# Patient Record
Sex: Male | Born: 1990 | Hispanic: No | Marital: Single | Smoking: Never smoker
Health system: Southern US, Community
[De-identification: ages and names within clinical notes are randomized; demographics above are authoritative.]

## PROBLEM LIST (undated history)

## (undated) DIAGNOSIS — I509 Heart failure, unspecified: Secondary | ICD-10-CM

## (undated) DIAGNOSIS — I2699 Other pulmonary embolism without acute cor pulmonale: Secondary | ICD-10-CM

---

## 2019-09-04 ENCOUNTER — Other Ambulatory Visit: Payer: Self-pay

## 2019-09-04 ENCOUNTER — Encounter (HOSPITAL_COMMUNITY): Payer: Self-pay | Admitting: Emergency Medicine

## 2019-09-04 ENCOUNTER — Emergency Department (HOSPITAL_COMMUNITY): Payer: BLUE CROSS/BLUE SHIELD

## 2019-09-04 ENCOUNTER — Emergency Department (HOSPITAL_COMMUNITY)
Admission: EM | Admit: 2019-09-04 | Discharge: 2019-09-05 | Disposition: A | Discharge status: Home / Self Care | Payer: BLUE CROSS/BLUE SHIELD | Attending: Emergency Medicine | Admitting: Emergency Medicine

## 2019-09-04 DIAGNOSIS — Y929 Unspecified place or not applicable: Secondary | ICD-10-CM | POA: Diagnosis not present

## 2019-09-04 DIAGNOSIS — S99912A Unspecified injury of left ankle, initial encounter: Secondary | ICD-10-CM | POA: Diagnosis present

## 2019-09-04 DIAGNOSIS — Y999 Unspecified external cause status: Secondary | ICD-10-CM | POA: Diagnosis not present

## 2019-09-04 DIAGNOSIS — S93492A Sprain of other ligament of left ankle, initial encounter: Secondary | ICD-10-CM | POA: Diagnosis not present

## 2019-09-04 DIAGNOSIS — I509 Heart failure, unspecified: Secondary | ICD-10-CM | POA: Diagnosis not present

## 2019-09-04 DIAGNOSIS — Y939 Activity, unspecified: Secondary | ICD-10-CM | POA: Insufficient documentation

## 2019-09-04 DIAGNOSIS — X500XXA Overexertion from strenuous movement or load, initial encounter: Secondary | ICD-10-CM | POA: Diagnosis not present

## 2019-09-04 HISTORY — DX: Heart failure, unspecified: I50.9

## 2019-09-04 HISTORY — DX: Other pulmonary embolism without acute cor pulmonale: I26.99

## 2019-09-04 MED ORDER — HYDROCODONE-ACETAMINOPHEN 5-325 MG PO TABS
1.0000 | ORAL_TABLET | Freq: Once | ORAL | Status: AC
  Administered 2019-09-05: 1 via ORAL
  Filled 2019-09-04: qty 1

## 2019-09-04 NOTE — ED Triage Notes (Signed)
Pt presents for evaluation of left ankle pain after missing a step and twisting it.

## 2019-09-04 NOTE — ED Provider Notes (Signed)
[facility] DEPT Provider Note   CSN: [id number] Arrival date & time: 09/04/19  2255     History   Chief Complaint Chief Complaint  Patient presents with  . Ankle Injury    HPI Kordell Jafri is a 28 y.o. male.     The history is provided by medical records and the patient. No language interpreter was used.  Ankle Injury   Deon Duer is a 28 y.o. male who presented to the emergency department tonight for constant, aching left ankle and foot pain after missing a step and twisting it oddly tonight.  Denies numbness or weakness.  Tried to take a Flexeril for the pain just prior to arrival but has not noticed any improvement. No dizziness, lightheadedness or weakness.  No chest pain or shortness of breath.  Past Medical History:  Diagnosis Date  . CHF (congestive heart failure) ([facility])   . Pulmonary embolus ([facility])     There are no active problems to display for this patient.       Home Medications    Prior to Admission medications   Medication Sig Start Date End Date Taking? Authorizing Provider  naproxen (NAPROSYN) 500 MG tablet Take 1 tablet (500 mg total) by mouth 2 (two) times daily as needed for mild pain or moderate pain. 09/05/19   Ward, Ozella Almond, PA-C    Family History No family history on file.  Social History Social History   Tobacco Use  . Smoking status: Never Smoker  . Smokeless tobacco: Never Used  Substance Use Topics  . Alcohol use: Never    Frequency: Never  . Drug use: Yes    Types: Marijuana     Allergies   Azithromycin and Peanut-containing drug products   Review of Systems Review of Systems  Musculoskeletal: Positive for arthralgias and myalgias.  Skin: Negative for color change and wound.  Neurological: Negative for weakness and numbness.     Physical Exam Updated Vital Signs BP (!) 160/98 (BP Location: Left Arm)   Pulse (!) 118   Temp 98.4 F (36.9 C) (Oral)   Resp 18   Ht 5\' 11"   (1.803 m)   Wt (!) 199.6 kg   SpO2 97%   BMI 61.37 kg/m   Physical Exam Vitals signs and nursing note reviewed.  Constitutional:      General: He is not in acute distress.    Appearance: He is well-developed.  HENT:     Head: Normocephalic and atraumatic.  Neck:     Musculoskeletal: Neck supple.  Cardiovascular:     Rate and Rhythm: Regular rhythm.     Heart sounds: Normal heart sounds. No murmur.  Pulmonary:     Effort: Pulmonary effort is normal. No respiratory distress.     Breath sounds: Normal breath sounds. No wheezing or rales.  Musculoskeletal:     Comments: Tenderness to the left ATFL, but no tenderness to either malleolus.  He does have tenderness to the fifth metatarsal region of the left foot.  2+ DP.  Sensation intact.  Able to wiggle all toes without difficulty has full range of motion of the ankle as well.  Skin:    General: Skin is warm and dry.  Neurological:     Mental Status: He is alert.      ED Treatments / Results  Labs (all labs ordered are listed, but only abnormal results are displayed) Labs Reviewed - No data to display  EKG None  Radiology Dg Ankle Complete  Left  Result Date: 09/04/2019 CLINICAL DATA:  Twisting injury, ankle pain EXAM: LEFT ANKLE COMPLETE - 3+ VIEW COMPARISON:  None. FINDINGS: Diffuse soft tissue swelling. No acute bony abnormality. Specifically, no fracture, subluxation, or dislocation. IMPRESSION: No acute bony abnormality. Electronically Signed   By: Charlett NoseKevin  Dover M.D.   On: 09/04/2019 23:39   Dg Foot Complete Left  Result Date: 09/04/2019 CLINICAL DATA:  Twisting injury, ankle and foot pain EXAM: LEFT FOOT - COMPLETE 3+ VIEW COMPARISON:  None FINDINGS: Soft tissue swelling along the dorsum of the foot. No acute bony abnormality. Specifically, no fracture, subluxation, or dislocation. IMPRESSION: No acute bony abnormality. Electronically Signed   By: Charlett NoseKevin  Dover M.D.   On: 09/04/2019 23:40    Procedures Procedures  (including critical care time)  Medications Ordered in ED Medications  HYDROcodone-acetaminophen (NORCO/VICODIN) 5-325 MG per tablet 1 tablet (1 tablet Oral Given 09/05/19 0022)     Initial Impression / Assessment and Plan / ED Course  I have reviewed the triage vital signs and the nursing notes.  Pertinent labs & imaging results that were available during my care of the patient were reviewed by me and considered in my medical decision making (see chart for details).       Cornell BarmanBradley Coggins is a 28 y.o. male who presents to ED for left foot and ankle pain after missing a step with inversion injury just prior to arrival. NVI on exam. No wounds. X-ray shows no acute abnormalities.  Symptomatic home care instructions were discussed.  Crutches and ASO brace provided.  PCP or Ortho follow-up.  Reasons to return to ER were discussed and all questions answered.   Final Clinical Impressions(s) / ED Diagnoses   Final diagnoses:  Sprain of anterior talofibular ligament of left ankle, initial encounter    ED Discharge Orders         Ordered    naproxen (NAPROSYN) 500 MG tablet  2 times daily PRN     09/05/19 0000           Ward, Chase PicketJaime Pilcher, PA-C 09/05/19 0025    Palumbo, April, MD 09/05/19 [id number]

## 2019-09-04 NOTE — Discharge Instructions (Signed)
Naproxen as needed for pain. You can also take Tylenol. Ice and alleviate for additional pain relief. If your symptoms persist without improvement in one week, please follow up with your primary care doctor or the orthopedist listed.   TREATMENT  Rest, ice, elevation, and compression are the basic modes of treatment.    HOME CARE INSTRUCTIONS  Apply ice to the sore area for 15 to 20 minutes, 3 to 4 times per day. Do this while you are awake for the first 2 days. This can be stopped when the swelling goes away. Put the ice in a plastic bag and place a towel between the bag of ice and your skin.  Keep your leg elevated when possible to lessen swelling.  You may take off your ankle stabilizer at night and to take a shower or bath. Wiggle your toes several times per day if you are able.  ACTIVITY:            - Weight bearing as tolerated - if you can do it, do it. If it hurts, stop.             - Exercises should be limited to pain free range of motion            - Can start mobilization by tracing the alphabet with your foot in the air.      Return to ER if your toes are numb or blue, new or worsening symptoms develop or you have any additional concerns.

## 2019-09-05 MED ORDER — NAPROXEN 500 MG PO TABS: 500.0000 mg | ORAL_TABLET | Freq: Two times a day (BID) | ORAL | 0 refills | Status: AC | PRN

## 2020-04-18 IMAGING — DX DG FOOT COMPLETE 3+V*L*
3 series · 3 of 3 positions shown · non-contrast
Comparison: None

CLINICAL DATA: Twisting injury, ankle and foot pain

EXAM:
LEFT FOOT - COMPLETE 3+ VIEW

[foot ap (1 of 2)]
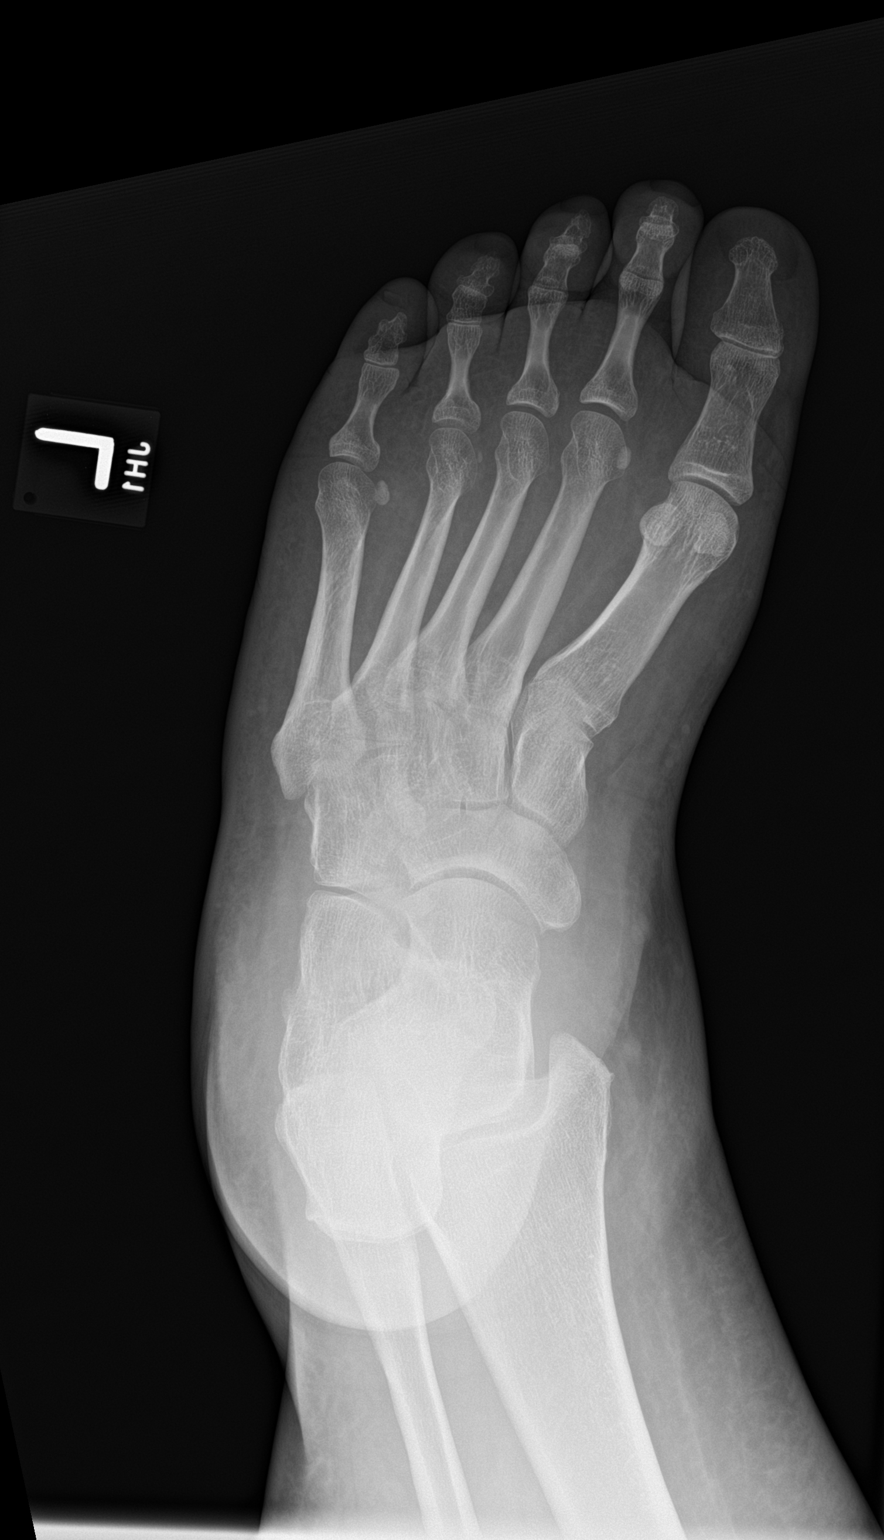

[foot lat]
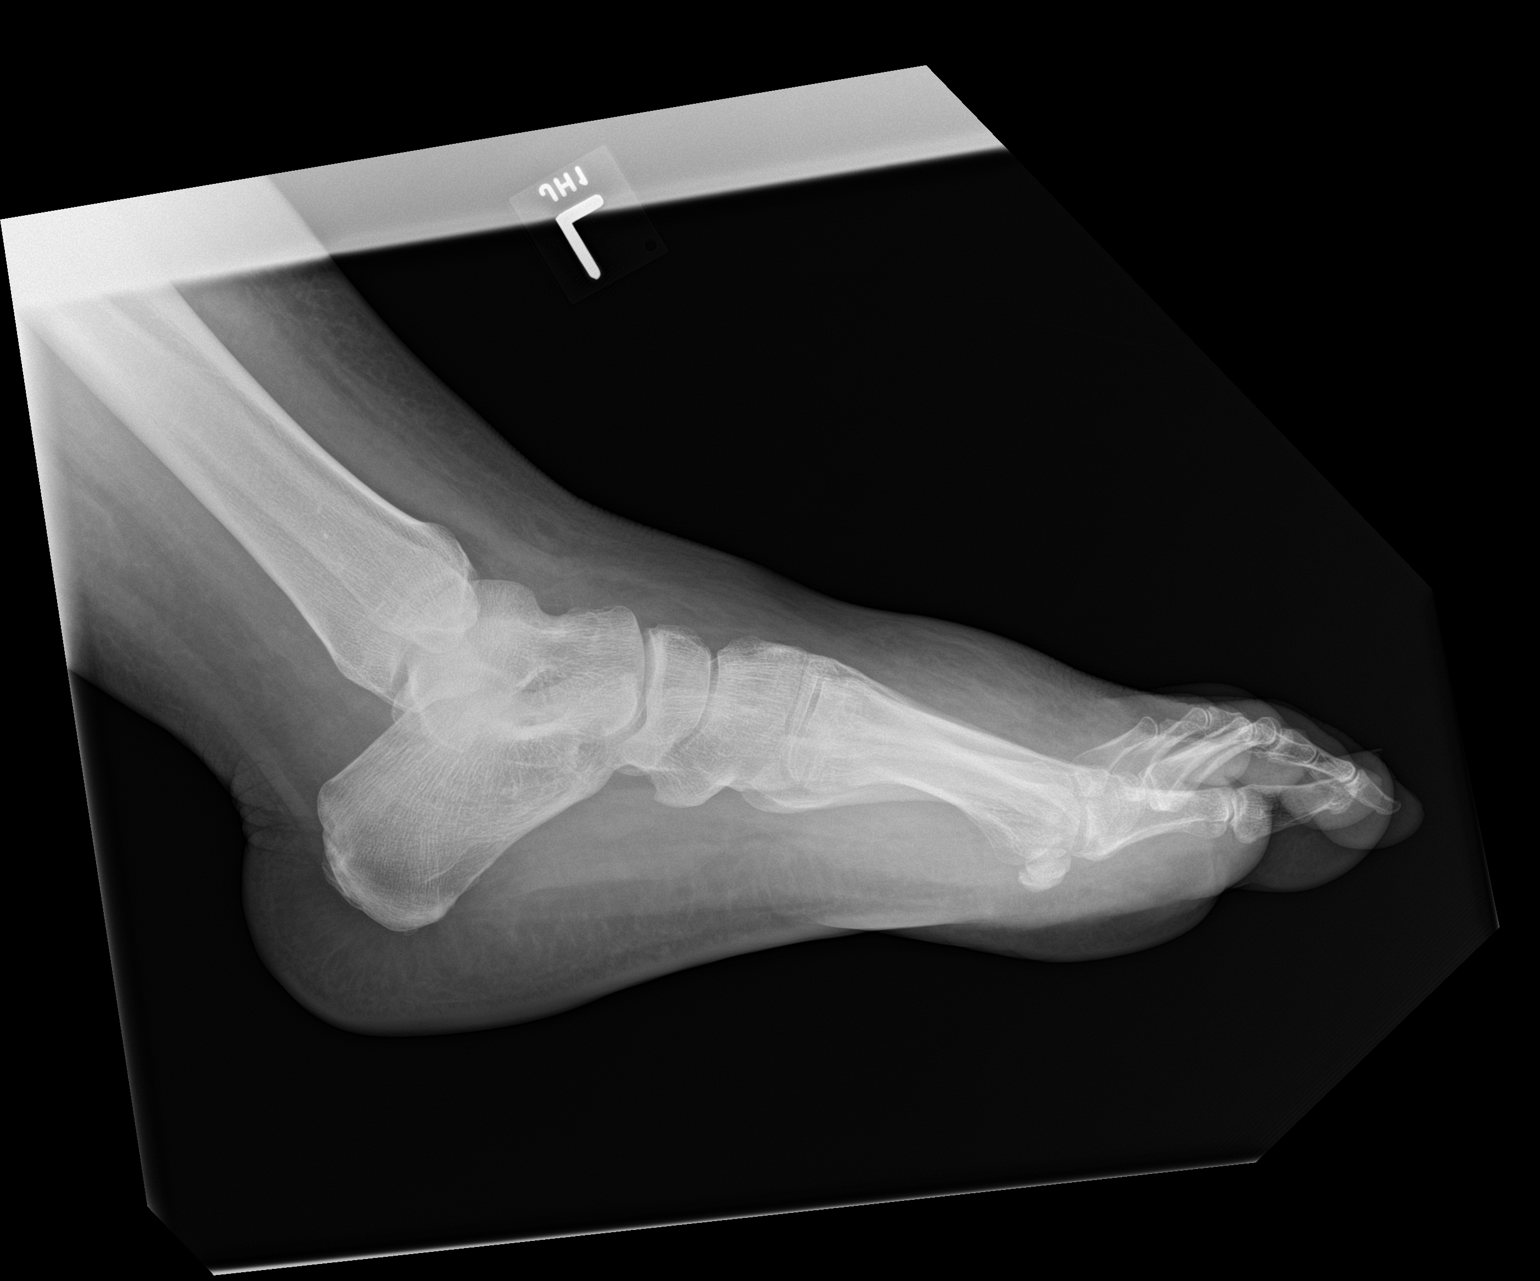

[foot ap (2 of 2)]
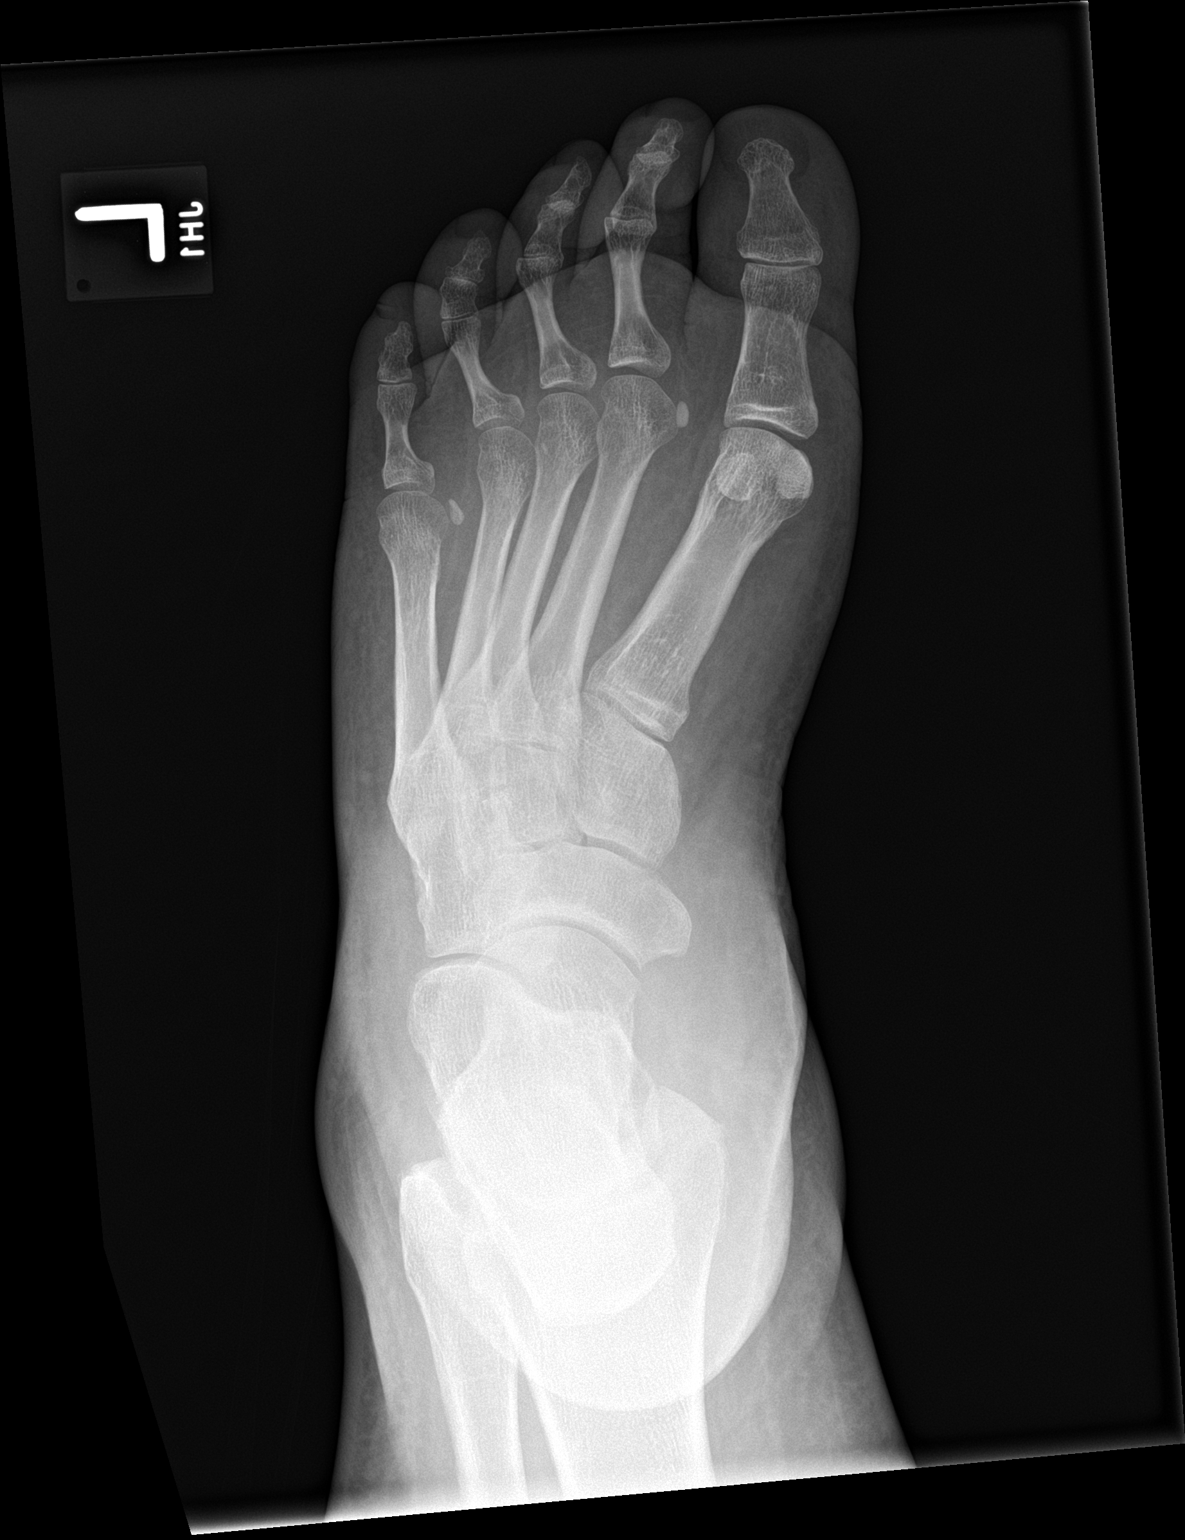

[3 of 3 positions shown; findings below may reference images not displayed]

FINDINGS: Soft tissue swelling along the dorsum of the foot. No acute bony
abnormality. Specifically, no fracture, subluxation, or dislocation.
IMPRESSION: No acute bony abnormality.
